# Patient Record
Sex: Male | Born: 1940 | Race: White | Hispanic: No | Smoking: Never smoker
Health system: Southern US, Community
[De-identification: ages and names within clinical notes are randomized; demographics above are authoritative.]

## PROBLEM LIST (undated history)

## (undated) DIAGNOSIS — S069X9A Unspecified intracranial injury with loss of consciousness of unspecified duration, initial encounter: Secondary | ICD-10-CM

## (undated) DIAGNOSIS — Q8500 Neurofibromatosis, unspecified: Secondary | ICD-10-CM

## (undated) DIAGNOSIS — S069XAA Unspecified intracranial injury with loss of consciousness status unknown, initial encounter: Secondary | ICD-10-CM

---

## 2018-06-17 ENCOUNTER — Inpatient Hospital Stay (HOSPITAL_COMMUNITY)
Admission: EM | Admit: 2018-06-17 | Discharge: 2018-06-20 | DRG: 193 | Disposition: A | Discharge status: Home Health | Payer: Medicare Other | Attending: Internal Medicine | Admitting: Internal Medicine

## 2018-06-17 ENCOUNTER — Encounter (HOSPITAL_COMMUNITY): Payer: Self-pay | Admitting: Internal Medicine

## 2018-06-17 ENCOUNTER — Other Ambulatory Visit: Payer: Self-pay

## 2018-06-17 ENCOUNTER — Emergency Department (HOSPITAL_COMMUNITY): Payer: Medicare Other

## 2018-06-17 DIAGNOSIS — G894 Chronic pain syndrome: Secondary | ICD-10-CM | POA: Diagnosis present

## 2018-06-17 DIAGNOSIS — N179 Acute kidney failure, unspecified: Secondary | ICD-10-CM | POA: Diagnosis present

## 2018-06-17 DIAGNOSIS — J181 Lobar pneumonia, unspecified organism: Secondary | ICD-10-CM | POA: Diagnosis not present

## 2018-06-17 DIAGNOSIS — Z880 Allergy status to penicillin: Secondary | ICD-10-CM | POA: Diagnosis not present

## 2018-06-17 DIAGNOSIS — Z79891 Long term (current) use of opiate analgesic: Secondary | ICD-10-CM

## 2018-06-17 DIAGNOSIS — J189 Pneumonia, unspecified organism: Secondary | ICD-10-CM | POA: Diagnosis present

## 2018-06-17 DIAGNOSIS — Z7983 Long term (current) use of bisphosphonates: Secondary | ICD-10-CM

## 2018-06-17 DIAGNOSIS — E875 Hyperkalemia: Secondary | ICD-10-CM | POA: Diagnosis present

## 2018-06-17 DIAGNOSIS — R4182 Altered mental status, unspecified: Secondary | ICD-10-CM | POA: Diagnosis not present

## 2018-06-17 DIAGNOSIS — Z87820 Personal history of traumatic brain injury: Secondary | ICD-10-CM

## 2018-06-17 DIAGNOSIS — G9341 Metabolic encephalopathy: Secondary | ICD-10-CM | POA: Diagnosis present

## 2018-06-17 DIAGNOSIS — G912 (Idiopathic) normal pressure hydrocephalus: Secondary | ICD-10-CM | POA: Diagnosis present

## 2018-06-17 DIAGNOSIS — J159 Unspecified bacterial pneumonia: Principal | ICD-10-CM | POA: Diagnosis present

## 2018-06-17 DIAGNOSIS — I10 Essential (primary) hypertension: Secondary | ICD-10-CM | POA: Diagnosis present

## 2018-06-17 DIAGNOSIS — J9601 Acute respiratory failure with hypoxia: Secondary | ICD-10-CM | POA: Diagnosis present

## 2018-06-17 DIAGNOSIS — Q8503 Schwannomatosis: Secondary | ICD-10-CM

## 2018-06-17 HISTORY — DX: Unspecified intracranial injury with loss of consciousness of unspecified duration, initial encounter: S06.9X9A

## 2018-06-17 HISTORY — DX: Unspecified intracranial injury with loss of consciousness status unknown, initial encounter: S06.9XAA

## 2018-06-17 HISTORY — DX: Neurofibromatosis, unspecified: Q85.00

## 2018-06-17 LAB — INFLUENZA PANEL BY PCR (TYPE A & B)
Influenza A By PCR: NEGATIVE
Influenza B By PCR: NEGATIVE

## 2018-06-17 LAB — STREP PNEUMONIAE URINARY ANTIGEN: Strep Pneumo Urinary Antigen: NEGATIVE

## 2018-06-17 LAB — COMPREHENSIVE METABOLIC PANEL
ALT: 20 U/L (ref 0–44)
AST: 57 U/L — AB (ref 15–41)
Albumin: 4.1 g/dL (ref 3.5–5.0)
Alkaline Phosphatase: 61 U/L (ref 38–126)
Anion gap: 14 (ref 5–15)
BUN: 27 mg/dL — AB (ref 8–23)
CO2: 27 mmol/L (ref 22–32)
Calcium: 9.2 mg/dL (ref 8.9–10.3)
Chloride: 95 mmol/L — ABNORMAL LOW (ref 98–111)
Creatinine, Ser: 1.52 mg/dL — ABNORMAL HIGH (ref 0.61–1.24)
GFR calc Af Amer: 51 mL/min — ABNORMAL LOW (ref >60)
GFR, EST NON AFRICAN AMERICAN: 44 mL/min — AB (ref >60)
Glucose, Bld: 121 mg/dL — ABNORMAL HIGH (ref 70–99)
Potassium: 5.9 mmol/L — ABNORMAL HIGH (ref 3.5–5.1)
Sodium: 136 mmol/L (ref 135–145)
Total Bilirubin: 1.8 mg/dL — ABNORMAL HIGH (ref 0.3–1.2)
Total Protein: 6.5 g/dL (ref 6.5–8.1)

## 2018-06-17 LAB — CBC WITH DIFFERENTIAL/PLATELET
Abs Immature Granulocytes: 0.06 10*3/uL (ref 0.00–0.07)
Basophils Absolute: 0 10*3/uL (ref 0.0–0.1)
Basophils Relative: 0 %
Eosinophils Absolute: 0.1 10*3/uL (ref 0.0–0.5)
Eosinophils Relative: 1 %
HCT: 44.4 % (ref 39.0–52.0)
Hemoglobin: 14.8 g/dL (ref 13.0–17.0)
Immature Granulocytes: 0 %
Lymphocytes Relative: 10 %
Lymphs Abs: 1.5 10*3/uL (ref 0.7–4.0)
MCH: 28.8 pg (ref 26.0–34.0)
MCHC: 33.3 g/dL (ref 30.0–36.0)
MCV: 86.4 fL (ref 80.0–100.0)
Monocytes Absolute: 1.2 10*3/uL — ABNORMAL HIGH (ref 0.1–1.0)
Monocytes Relative: 8 %
Neutro Abs: 12.6 10*3/uL — ABNORMAL HIGH (ref 1.7–7.7)
Neutrophils Relative %: 81 %
Platelets: 271 10*3/uL (ref 150–400)
RBC: 5.14 MIL/uL (ref 4.22–5.81)
RDW: 13.5 % (ref 11.5–15.5)
WBC: 15.5 10*3/uL — ABNORMAL HIGH (ref 4.0–10.5)
nRBC: 0 % (ref 0.0–0.2)

## 2018-06-17 LAB — URINALYSIS, ROUTINE W REFLEX MICROSCOPIC
BILIRUBIN URINE: NEGATIVE
Glucose, UA: NEGATIVE mg/dL
Hgb urine dipstick: NEGATIVE
Ketones, ur: NEGATIVE mg/dL
Leukocytes, UA: NEGATIVE
Nitrite: NEGATIVE
PH: 5 (ref 5.0–8.0)
Protein, ur: NEGATIVE mg/dL
Specific Gravity, Urine: 1.017 (ref 1.005–1.030)

## 2018-06-17 LAB — PROTIME-INR
INR: 1.03
Prothrombin Time: 13.4 seconds (ref 11.4–15.2)

## 2018-06-17 LAB — LACTIC ACID, PLASMA
Lactic Acid, Venous: 1.9 mmol/L (ref 0.5–1.9)
Lactic Acid, Venous: 1.6 mmol/L (ref 0.5–1.9)

## 2018-06-17 MED ORDER — PREGABALIN 100 MG PO CAPS
200.0000 mg | ORAL_CAPSULE | Freq: Two times a day (BID) | ORAL | Status: DC
  Administered 2018-06-17 – 2018-06-20 (×7): 200 mg via ORAL
  Filled 2018-06-17 (×2): qty 2
  Filled 2018-06-17: qty 4
  Filled 2018-06-17: qty 2
  Filled 2018-06-17: qty 4
  Filled 2018-06-17 (×2): qty 2

## 2018-06-17 MED ORDER — VITAMIN D (ERGOCALCIFEROL) 1.25 MG (50000 UNIT) PO CAPS: 50000.0000 [IU] | ORAL_CAPSULE | ORAL | Status: DC

## 2018-06-17 MED ORDER — PANTOPRAZOLE SODIUM 40 MG PO TBEC
40.0000 mg | DELAYED_RELEASE_TABLET | Freq: Every day | ORAL | Status: DC
  Administered 2018-06-17 – 2018-06-20 (×4): 40 mg via ORAL
  Filled 2018-06-17 (×4): qty 1

## 2018-06-17 MED ORDER — MORPHINE SULFATE ER 30 MG PO TBCR
30.0000 mg | EXTENDED_RELEASE_TABLET | Freq: Two times a day (BID) | ORAL | Status: DC
  Administered 2018-06-17 – 2018-06-20 (×7): 30 mg via ORAL
  Filled 2018-06-17 (×7): qty 1

## 2018-06-17 MED ORDER — DOCUSATE SODIUM 100 MG PO CAPS
100.0000 mg | ORAL_CAPSULE | Freq: Every day | ORAL | Status: DC
  Administered 2018-06-17 – 2018-06-19 (×3): 100 mg via ORAL
  Filled 2018-06-17 (×3): qty 1

## 2018-06-17 MED ORDER — VANCOMYCIN HCL IN DEXTROSE 1-5 GM/200ML-% IV SOLN
1000.0000 mg | Freq: Once | INTRAVENOUS | Status: AC
  Administered 2018-06-17: 1000 mg via INTRAVENOUS
  Filled 2018-06-17: qty 200

## 2018-06-17 MED ORDER — SODIUM CHLORIDE 0.9% FLUSH
3.0000 mL | Freq: Once | INTRAVENOUS | Status: AC
  Administered 2018-06-17: 3 mL via INTRAVENOUS

## 2018-06-17 MED ORDER — DULOXETINE HCL 20 MG PO CPEP
40.0000 mg | ORAL_CAPSULE | Freq: Every day | ORAL | Status: DC
  Administered 2018-06-17 – 2018-06-20 (×4): 40 mg via ORAL
  Filled 2018-06-17 (×4): qty 2

## 2018-06-17 MED ORDER — METOPROLOL SUCCINATE ER 25 MG PO TB24
25.0000 mg | ORAL_TABLET | Freq: Two times a day (BID) | ORAL | Status: DC
  Administered 2018-06-17 – 2018-06-20 (×7): 25 mg via ORAL
  Filled 2018-06-17 (×7): qty 1

## 2018-06-17 MED ORDER — PIPERACILLIN-TAZOBACTAM 3.375 G IVPB
3.3750 g | Freq: Once | INTRAVENOUS | Status: AC
  Administered 2018-06-17: 3.375 g via INTRAVENOUS
  Filled 2018-06-17: qty 50

## 2018-06-17 MED ORDER — LEVOFLOXACIN IN D5W 750 MG/150ML IV SOLN
750.0000 mg | INTRAVENOUS | Status: DC
  Administered 2018-06-17: 750 mg via INTRAVENOUS
  Filled 2018-06-17: qty 150

## 2018-06-17 MED ORDER — ENOXAPARIN SODIUM 30 MG/0.3ML ~~LOC~~ SOLN
30.0000 mg | SUBCUTANEOUS | Status: DC
  Administered 2018-06-17 – 2018-06-18 (×2): 30 mg via SUBCUTANEOUS
  Filled 2018-06-17 (×2): qty 0.3

## 2018-06-17 MED ORDER — HYDROCODONE-ACETAMINOPHEN 5-325 MG PO TABS
1.0000 | ORAL_TABLET | Freq: Two times a day (BID) | ORAL | Status: DC | PRN
  Administered 2018-06-18 – 2018-06-19 (×2): 1 via ORAL
  Filled 2018-06-17 (×2): qty 1

## 2018-06-17 MED ORDER — SODIUM CHLORIDE 0.9 % IV SOLN
INTRAVENOUS | Status: DC
  Administered 2018-06-17 – 2018-06-20 (×3): via INTRAVENOUS

## 2018-06-17 MED ORDER — B COMPLEX-C PO TABS
1.0000 | ORAL_TABLET | Freq: Every day | ORAL | Status: DC
  Administered 2018-06-17 – 2018-06-20 (×4): 1 via ORAL
  Filled 2018-06-17 (×4): qty 1

## 2018-06-17 MED ORDER — ALBUTEROL SULFATE (2.5 MG/3ML) 0.083% IN NEBU: 2.5000 mg | INHALATION_SOLUTION | RESPIRATORY_TRACT | Status: DC | PRN

## 2018-06-17 NOTE — Evaluation (Signed)
Physical Therapy Evaluation Patient Details Name: Nathaniel Pacheco MRN: [id number] DOB: [birth year] Today's Date: 06/17/2018   History of Present Illness  Patient is a 78 y/o male who presents with AMS. CXR-shows bilateral lower lobe consolidation. Admitted with acute respiratory failure with hypoxia secondary to CAP- bilateral lower lobe pneumonia versus atelectasis. 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 Pt lives with spouse and reports being Mod I for ADLs and ambulation. Not sure of accuracy of reported PLOF as pt with hx of cognitive deficits at baseline. No family present. Today, pt tolerated standing with heavy mod A and taking a few steps along side bed with Mod A with difficulty advancing RLE. Would benefit from SNF to maximize independence and mobility prior to return home. Will follow acutely.    Follow Up Recommendations SNF;Supervision for mobility/OOB    Equipment Recommendations  None recommended by PT    Recommendations for Other Services       Precautions / Restrictions Precautions Precautions: Fall Restrictions Weight Bearing Restrictions: No      Mobility  Bed Mobility Overal bed mobility: Needs Assistance Bed Mobility: Supine to Sit     Supine to sit: Min assist;HOB elevated     General bed mobility comments: increased time and use of rail to get to EOB with cues. POsterior lean.  Transfers Overall transfer level: Needs assistance Equipment used: Rolling walker (2 wheeled) Transfers: Sit to/from Stand Sit to Stand: Mod assist;From elevated surface         General transfer comment: Heavy Mod A to power to standing with cues for hand placement/technique. BLEs  gave out once up on first stand and able to stand again with increased WB through BUEs.   Ambulation/Gait Ambulation/Gait assistance: Mod assist Gait Distance (Feet): 3 Feet Assistive device: Rolling walker (2 wheeled) Gait Pattern/deviations: Step-to pattern;Trunk flexed Gait velocity: decreased   General Gait Details: Able to side step along side bed with assist for weight shifting; difficulty advancing RLE.  Stairs            Wheelchair Mobility    Modified Rankin (Stroke Patients Only)       Balance Overall balance assessment: Needs assistance Sitting-balance support: Feet supported;Single extremity supported Sitting balance-Leahy Scale: Fair Sitting balance - Comments: Posterior lean sitting EOB, fatigues quickly through trunk Postural control: Posterior lean Standing balance support: During functional activity;Bilateral upper extremity supported Standing balance-Leahy Scale: Poor Standing balance comment: Requires BUE support and external support ins tanding.                             Pertinent Vitals/Pain Pain Assessment: Faces Faces Pain Scale: Hurts little more Pain Location: RLE with movement Pain Descriptors / Indicators: Aching;Sore Pain Intervention(s): Monitored during session;Repositioned    Home Living Family/patient expects to be discharged to:: Private residence Living Arrangements: Spouse/significant other Available Help at Discharge: Family Type of Home: House Home Access: Stairs to enter Entrance Stairs-Rails: Right Entrance Stairs-Number of Steps: 3 + 3 Home Layout: One level Home Equipment: Walker - 2 wheels;Cane - single point;Shower seat;Wheelchair - manual Additional Comments: not sure of accuracy of reported PLOF due to cognitive deficits.    Prior Function Level of Independence: Independent with assistive device(s)  Comments: uses RW vs SPC for ambulation. Does not drive. Does own ADLs.     Hand  Dominance   Dominant Hand: Right    Extremity/Trunk Assessment   Upper Extremity Assessment Upper Extremity Assessment: Defer to OT evaluation    Lower Extremity Assessment Lower Extremity Assessment: RLE deficits/detail RLE Sensation: decreased light touch RLE Coordination: decreased fine motor;decreased gross motor    Cervical / Trunk Assessment Cervical / Trunk Assessment: Kyphotic  Communication   Communication: No difficulties  Cognition Arousal/Alertness: Awake/alert Behavior During Therapy: WFL for tasks assessed/performed Overall Cognitive Status: Impaired/Different from baseline Area of Impairment: Orientation;Awareness;Problem solving;Memory                 Orientation Level: Disoriented to;Situation   Memory: Decreased short-term memory;Decreased recall of precautions Following Commands: Follows multi-step commands inconsistently;Follows one step commands with increased time   Awareness: Intellectual Problem Solving: Requires verbal cues;Slow processing General Comments: "How old am i?" Able to state bday but not able to problem solve how old he is despite knowing the year he was born and this year. Tangential in speech.      General Comments      Exercises     Assessment/Plan    PT Assessment Patient needs continued PT services  PT Problem List Decreased strength;Decreased mobility;Decreased coordination;Decreased activity tolerance;Decreased cognition;Pain;Impaired sensation;Decreased balance       PT Treatment Interventions Functional mobility training;Balance training;Patient/family education;Gait training;Therapeutic activities;Therapeutic exercise;Neuromuscular re-education;Wheelchair mobility training;DME instruction    PT Goals (Current goals can be found in the Care Plan section)  Acute Rehab PT Goals Patient Stated Goal: to get back to moving and driving PT Goal Formulation: With patient Time For Goal Achievement: 07/01/18 Potential to  Achieve Goals: Fair    Frequency Min 3X/week   Barriers to discharge   not sure of support at home    Co-evaluation               AM-PAC PT "6 Clicks" Mobility  Outcome Measure Help needed turning from your back to your side while in a flat bed without using bedrails?: A Little Help needed moving from lying on your back to sitting on the side of a flat bed without using bedrails?: A Little Help needed moving to and from a bed to a chair (including a wheelchair)?: A Lot Help needed standing up from a chair using your arms (e.g., wheelchair or bedside chair)?: A Lot Help needed to walk in hospital room?: A Lot Help needed climbing 3-5 steps with a railing? : A Lot 6 Click Score: 14    End of Session Equipment Utilized During Treatment: Gait belt Activity Tolerance: Patient limited by fatigue Patient left: in bed;with call bell/phone within reach;with bed alarm set Nurse Communication: Mobility status(need for recliner for room) PT Visit Diagnosis: Unsteadiness on feet (R26.81);Muscle weakness (generalized) (M62.81);Difficulty in walking, not elsewhere classified (R26.2);Pain Pain - Right/Left: Right Pain - part of body: Leg    Time: 1530-1600 PT Time Calculation (min) (ACUTE ONLY): 30 min   Charges:   PT Evaluation $PT Eval Moderate Complexity: 1 Mod PT Treatments $Therapeutic Activity: 8-22 mins        Mylo Red, PT, DPT Acute Rehabilitation Services Pager [phone] Office [phone]      Marcy Panning 06/17/2018, 4:33 PM

## 2018-06-17 NOTE — ED Triage Notes (Signed)
Pt brought in by EMS called by pt's wife. Wife told EMS that pt has had progressive AMS since yesterday at around 1900.  Pt normally uses walker and has tremors at baseline. Pt temp 102.6 in the field BP149/84, 94 bpm, CBG 151.Pt not following commands, hx of traumatic brain injury. O2 saturation 84% on RA, placed on 4L and O2 up to 95% prior to arrival.

## 2018-06-17 NOTE — ED Provider Notes (Signed)
[facility]   CSN: [id number] Arrival date & time: 06/17/18  0230     History   Chief Complaint Chief Complaint  Patient presents with  . Altered Mental Status    HPI Nathaniel Pacheco is a 78 y.o. male.  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  He also has had a subdural hematoma in the past.  He presents today for evaluation of altered mental status.  His wife states that he became weak and somewhat confused this evening.  She denies any specific complaints and the patient denies any specific aches or pains.  He is somewhat difficult to get a history from as he appears confused.  He was found to have a fever by EMS.  He was also noted to be initially hypoxic with O2 sats of 84%.  These increased with supplemental oxygen.  The history is provided by the patient.  Altered Mental Status  Presenting symptoms: disorientation   Severity:  Moderate Most recent episode:  Today Episode history:  Single Timing:  Constant Progression:  Unchanged Chronicity:  New   No past medical history on file.  There are no active problems to display for this patient.        Home Medications    Prior to Admission medications   Not on File    Family History No family history on file.  Social History Social History   Tobacco Use  . Smoking status: Not on file  Substance Use Topics  . Alcohol use: Not on file  . Drug use: Not on file     Allergies   Patient has no allergy information on record.   Review of Systems Review of Systems  All other systems reviewed and are negative.    Physical Exam Updated Vital Signs BP (!) 153/79 (BP Location: Left Arm)   Pulse 92   Temp (!) 102.8 F (39.3 C) (Oral)   SpO2 98%   Physical Exam Vitals signs and nursing note reviewed.  Constitutional:      General:  He is not in acute distress.    Appearance: He is well-developed. He is not diaphoretic.     Comments: Patient is a 78 year old male.  He appears somewhat confused.  He is slow to respond to questions and commands, but will communicate more readily with his wife.  HENT:     Head: Normocephalic and atraumatic.  Neck:     Musculoskeletal: Normal range of motion and neck supple.  Cardiovascular:     Rate and Rhythm: Normal rate and regular rhythm.     Heart sounds: No murmur. No friction rub.  Pulmonary:     Effort: Pulmonary effort is normal. No respiratory distress.     Breath sounds: Normal breath sounds. No wheezing or rales.  Abdominal:     General: Bowel sounds are normal. There is no distension.     Palpations: Abdomen is soft.     Tenderness: There is no abdominal tenderness.  Musculoskeletal: Normal range of motion.        General: No swelling or tenderness.     Right lower leg: No edema.     Left lower leg: No edema.  Skin:    General: Skin is warm and dry.  Neurological:     Mental Status: He is alert. He is disoriented.     Cranial Nerves:  No cranial nerve deficit.     Coordination: Coordination normal.      ED Treatments / Results  Labs (all labs ordered are listed, but only abnormal results are displayed) Labs Reviewed  CULTURE, BLOOD (ROUTINE X 2)  CULTURE, BLOOD (ROUTINE X 2)  URINE CULTURE  COMPREHENSIVE METABOLIC PANEL  LACTIC ACID, PLASMA  LACTIC ACID, PLASMA  CBC WITH DIFFERENTIAL/PLATELET  PROTIME-INR  URINALYSIS, ROUTINE W REFLEX MICROSCOPIC    EKG EKG Interpretation  Date/Time:  Friday June 17 2018 02:40:39 EST Ventricular Rate:  93 PR Interval:    QRS Duration: 90 QT Interval:  342 QTC Calculation: 426 R Axis:   -17 Text Interpretation:  Sinus rhythm Borderline left axis deviation Consider anterior infarct Baseline wander in lead(s) V6 Confirmed by Geoffery LyonseLo, Ellyana Crigler ([id number]) on 06/17/2018 3:10:56 AM   Radiology No results  found.  Procedures Procedures (including critical care time)  Medications Ordered in ED Medications  sodium chloride flush (NS) 0.9 % injection 3 mL (3 mLs Intravenous Given 06/17/18 0258)     Initial Impression / Assessment and Plan / ED Course  I have reviewed the triage vital signs and the nursing notes.  Pertinent labs & imaging results that were available during my care of the patient were reviewed by me and considered in my medical decision making (see chart for details).  Patient presenting with complaints of progressive weakness since yesterday.  He was somewhat confused this evening and wife called EMS.  The patient was found to have a fever of 102.8.  He also has a white count of 15,000 and x-ray that shows what appears to be bilateral lower lobe pneumonia.  The patient was given broad-spectrum antibiotics and will be admitted to the hospitalist service under the care of Dr. Julian ReilGardner.  Head CT was negative for acute CVA.  CRITICAL CARE Performed by: Geoffery Lyonsouglas Dalayah Deahl Total critical care time: 45 minutes Critical care time was exclusive of separately billable procedures and treating other patients. Critical care was necessary to treat or prevent imminent or life-threatening deterioration. Critical care was time spent personally by me on the following activities: development of treatment plan with patient and/or surrogate as well as nursing, discussions with consultants, evaluation of patient's response to treatment, examination of patient, obtaining history from patient or surrogate, ordering and performing treatments and interventions, ordering and review of laboratory studies, ordering and review of radiographic studies, pulse oximetry and re-evaluation of patient's condition.   Final Clinical Impressions(s) / ED Diagnoses   Final diagnoses:  None    ED Discharge Orders    None       Geoffery Lyonselo, Kinsey Cowsert, MD 06/17/18 [phone]

## 2018-06-17 NOTE — H&P (Signed)
History and Physical    Nathaniel Pacheco NFA:[id number] DOB: April 09, 1941 DOA: 06/17/2018  PCP: No primary care provider on file.  Dr. Lucretia Roers year. Patient coming from: Home.  I have personally briefly reviewed patient's old medical records available.   Chief Complaint: Altered mental status and confusion.  HPI: Nathaniel Pacheco is a 78 y.o. 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  Patient is currently not able to give any history due to confusion.  I reviewed his previous records from care everywhere, his neurological notes and also called his wife to discuss about him. According to patient's wife, after he had his subdural hematoma last year, he had been progressively and intermittently confused and having vertigo.  He is following up with neurosurgery, recent CT scans were stable.  They were planning to do MRI of the brain to see for progression of neurofibromatosis or any effect on vestibular nerve.  He has suffering from confusion and disorientation at home for at least 1 month.His wife noted that last night he was more confused and was feeling very weak so she called EMS.  She never noticed any fever, flulike symptoms.  She never noticed any cough.  Patient himself could not describe any symptoms.  EMS found him with temperature of 102.  He was noted to be hypoxic and 84% on room air at home. ED Course: Temperature 102.8.  Blood pressure is stable.  WBC count 15.5.  Creatinine 1.52, baseline unknown.  Potassium 5.9.  Chest x-ray shows bilateral lower lobe consolidation.  CT head shows no acute findings, however dilated ventricles and atrophy.  Flu swab was negative.  Lactic acid was normal.  Patient was given IV  fluids, vancomycin and Zosyn.  Review of Systems: Limited due to confusion.   Past Medical History:  Diagnosis Date  . Neurofibromatosis (HCC)   . TBI (traumatic brain injury) [facility])     History reviewed. No pertinent surgical history.   has no history on file for tobacco, alcohol, and drug.  Allergies  Allergen Reactions  . Penicillins Itching, Other (See Comments) and Swelling    Hives Arm Erythema and Swelling at the site of IM Injection     History reviewed. No pertinent family history.   Prior to Admission medications   Medication Sig Start Date End Date Taking? Authorizing Provider  alendronate (FOSAMAX) 70 MG tablet Take 70 mg by mouth every Monday. 09/20/17  Yes [provider]  B Complex-C (B-COMPLEX WITH VITAMIN C) tablet Take 1 tablet by mouth daily.   Yes [provider]  docusate sodium (COLACE) 100 MG capsule Take 100 mg by mouth at bedtime.   Yes [provider]  DULoxetine (CYMBALTA) 20 MG capsule Take 40 mg by mouth daily. 04/15/18  Yes [provider]  enalapril (VASOTEC) 20 MG tablet Take 20 mg by mouth 2 (two) times daily. 04/14/18  Yes [provider]  hydrochlorothiazide (HYDRODIURIL) 25 MG tablet Take 25 mg by mouth daily. 09/16/15  Yes [provider]  HYDROcodone-acetaminophen (NORCO/VICODIN) 5-325 MG tablet Take 1 tablet by mouth 2 (two) times daily as needed for moderate pain.  12/31/17  Yes [provider]  ibuprofen (ADVIL,MOTRIN) 200 MG tablet Take 200 mg by mouth every 6 (six) hours as needed for mild pain.  Yes [provider]  meclizine (ANTIVERT) 25 MG tablet Take 25 mg by mouth 3 (three) times daily. 06/01/18  Yes [provider]  metoprolol succinate (TOPROL-XL) 25 MG 24 hr tablet Take 25 mg by mouth 2 (two) times daily. 12/04/14  Yes [provider]  morphine (MS CONTIN) 30 MG 12 hr tablet Take 30 mg by mouth 2 (two) times daily. 05/30/18 06/29/18 Yes [provider]  omeprazole (PRILOSEC) 20 MG capsule Take 20 mg by mouth daily. 11/17/14  Yes [provider]  pregabalin (LYRICA) 200 MG capsule Take 200 mg by mouth 2 (two) times daily. 04/27/18  Yes [provider]  Vitamin D, Ergocalciferol, (DRISDOL) 1.25 MG (50000 UT) CAPS capsule Take 50,000 Units by mouth every Tuesday.  04/18/18  Yes [provider]    Physical Exam: Vitals:   06/17/18 0655 06/17/18 0700 06/17/18 0730 06/17/18 0745  BP: (!) 115/57 (!) 106/55  120/63  Pulse: 66 65 63 71  Resp: 14 13 15 18   Temp:      TempSrc:      SpO2: 100% 100% 98% 100%  Weight:      Height:        Constitutional: NAD, calm, comfortable Vitals:   06/17/18 0655 06/17/18 0700 06/17/18 0730 06/17/18 0745  BP: (!) 115/57 (!) 106/55  120/63  Pulse: 66 65 63 71  Resp: 14 13 15 18   Temp:      TempSrc:      SpO2: 100% 100% 98% 100%  Weight:      Height:       Eyes: PERRL, lids and conjunctivae normal ENMT: Mucous membranes are dry. Posterior pharynx clear of any exudate or lesions.Normal dentition.  Neck: normal, supple, no masses, no thyromegaly Respiratory: clear to auscultation bilaterally, no wheezing, no crackles. Normal respiratory effort. No accessory muscle use.  Patient is without any distress on waking up.  He is on 3 L of oxygen right now. Cardiovascular: Regular rate and rhythm, no murmurs / rubs / gallops. No extremity edema. 2+ pedal pulses. No carotid bruits.  Abdomen: no tenderness, no masses palpated. No hepatosplenomegaly. Bowel sounds positive.  Musculoskeletal: no clubbing / cyanosis. No joint deformity upper and lower extremities. Good ROM, no contractures. Normal muscle tone.  Skin: no rashes, lesions, ulcers. No induration Neurologic: CN 2-12 grossly intact. Sensation intact, DTR normal. Strength 5/5 in all 4.  Psychiatric: Sleepy, pleasantly confused.  Alert and awake but not oriented.    Labs on Admission: I have personally reviewed  following labs and imaging studies  CBC: Recent Labs  Lab 06/17/18 0242  WBC 15.5*  NEUTROABS 12.6*  HGB 14.8  HCT 44.4  MCV 86.4  PLT 271   Basic Metabolic Panel: Recent Labs  Lab 06/17/18 0242  NA 136  K 5.9*  CL 95*  CO2 27  GLUCOSE 121*  BUN 27*  CREATININE 1.52*  CALCIUM 9.2   GFR: Estimated Creatinine Clearance: 45 mL/min (A) (by C-G formula based on SCr of 1.52 mg/dL (H)). Liver Function Tests: Recent Labs  Lab 06/17/18 0242  AST 57*  ALT 20  ALKPHOS 61  BILITOT 1.8*  PROT 6.5  ALBUMIN 4.1   No results for input(s): LIPASE, AMYLASE in the last 168 hours. No results for input(s): AMMONIA in the last 168 hours. Coagulation Profile: Recent Labs  Lab 06/17/18 0352  INR 1.03   Cardiac Enzymes: No results for input(s): CKTOTAL, CKMB, CKMBINDEX, TROPONINI in the last 168 hours. BNP (last 3  results) No results for input(s): PROBNP in the last 8760 hours. HbA1C: No results for input(s): HGBA1C in the last 72 hours. CBG: No results for input(s): GLUCAP in the last 168 hours. Lipid Profile: No results for input(s): CHOL, HDL, LDLCALC, TRIG, CHOLHDL, LDLDIRECT in the last 72 hours. Thyroid Function Tests: No results for input(s): TSH, T4TOTAL, FREET4, T3FREE, THYROIDAB in the last 72 hours. Anemia Panel: No results for input(s): VITAMINB12, FOLATE, FERRITIN, TIBC, IRON, RETICCTPCT in the last 72 hours. Urine analysis:    Component Value Date/Time   COLORURINE YELLOW 06/17/2018 0414   APPEARANCEUR CLEAR 06/17/2018 0414   LABSPEC 1.017 06/17/2018 0414   PHURINE 5.0 06/17/2018 0414   GLUCOSEU NEGATIVE 06/17/2018 0414   HGBUR NEGATIVE 06/17/2018 0414   BILIRUBINUR NEGATIVE 06/17/2018 0414   KETONESUR NEGATIVE 06/17/2018 0414   PROTEINUR NEGATIVE 06/17/2018 0414   NITRITE NEGATIVE 06/17/2018 0414   LEUKOCYTESUR NEGATIVE 06/17/2018 0414    Radiological Exams on Admission: Dg Chest 2 View  Result Date: 06/17/2018 CLINICAL DATA:  Suspected sepsis.   Altered mental status EXAM: CHEST - 2 VIEW COMPARISON:  None. FINDINGS: Hazy opacities at the left more than right base. Borderline heart size accentuated by low volumes. No effusion or pneumothorax. Artifact from EKG leads. IMPRESSION: Low volume chest with asymmetric lower lung opacities from pneumonia, aspiration, or atelectasis. Electronically Signed   By: Marnee SpringJonathon  Watts M.D.   On: 06/17/2018 05:21   Ct Head Wo Contrast  Result Date: 06/17/2018 CLINICAL DATA:  Progressive altered mental status since yesterday. EXAM: CT HEAD WITHOUT CONTRAST TECHNIQUE: Contiguous axial images were obtained from the base of the skull through the vertex without intravenous contrast. COMPARISON:  None. FINDINGS: Brain: Mild cerebral atrophy. Ventricular dilatation is somewhat out of proportion to the degree of peripheral atrophy and may indicate normal pressure hydrocephalus or prominent central atrophy. Low-attenuation changes in the deep white matter consistent with small vessel ischemia. No mass effect or midline shift. No abnormal extra-axial fluid collections. Gray-white matter junctions are distinct. Basal cisterns are not effaced. No acute intracranial hemorrhage. Vascular: Mild intracranial arterial vascular calcifications. Skull: Calvarium appears intact. No acute depressed skull fractures. Sinuses/Orbits: Paranasal sinuses and mastoid air cells are clear. Other: None IMPRESSION: Ventricular dilatation out of proportion to the degree of peripheral atrophy. Possibility of normal pressure hydrocephalus versus prominent central atrophy. No acute intracranial hemorrhage or mass effect. Chronic atrophy and small vessel ischemic changes. Electronically Signed   By: Burman NievesWilliam  Stevens M.D.   On: 06/17/2018 03:33    EKG: Independently reviewed. Sinus rhythm.  No acute ST-T wave changes.  Assessment/Plan Principal Problem:   Community acquired pneumonia of both lower lobes (HCC) Active Problems:   AKI (acute kidney  injury) (HCC)   Hyperkalemia   Acute respiratory failure with hypoxia (HCC)   History of traumatic brain injury   Acute metabolic encephalopathy   Pneumonia     1.  Community-acquired pneumonia, bilateral lower lobe pneumonia versus atelectasis: Patient with fever, leukocytosis and hypoxia.  Agree with admission given severity of symptoms. Antibiotics to treat bacterial pneumonia.  Received vancomycin and Zosyn in the ER.  Will continue with Levaquin only.Chest physiotherapy, incentive spirometry, deep breathing exercises, sputum induction, mucolytic's and bronchodilators.Sputum cultures, blood cultures, Legionella and streptococcal antigen. Supplemental oxygen to keep saturations more than 90%. Consult PT OT and also speech therapy to evaluate for aspiration.  2.  Acute respiratory failure with hypoxia: Due to #1.  Given oxygen to keep saturation more than 90%.  3.  Confusion disorientation: Acute metabolic encephalopathy: Unknown how much of this is new.  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  We will continue to monitor.  No focal deficits.  4.  Hyperkalemia: Patient on potassium supplements and ACE inhibitor's.  Will hydrate with IV fluids.  Monitor levels.  5.  Abnormal renal functions: Baseline creatinine not available.  Hydrate and monitor.  No urinary retention.  6.  Neurofibromatosis, history of subdural hematoma: Currently remains stable.  He may have progressive disease.  [facility]  I suggested patient's wife to make an appointment for follow-up.  CT scan today shows dilated ventricles, progressive disease.  He will follow-up with neurosurgery.  7.  Chronic pain syndrome: Patient on scheduled and as needed opiates.  He is on chronic opiates and Lyrica.  He will continue this.  Discussed plan of care with patient's wife over the phone.  DVT prophylaxis:  Lovenox. Code Status: Full code. Family Communication: Wife over the phone. Disposition Plan: Home with home health care. Consults called: None. Admission status: Inpatient.  Telemetry.   Dorcas CarrowKuber Marlayna Bannister MD [facility] Pager [phone]  If 7PM-7AM, please contact night-coverage [email or web address] Password [email or web address]  06/17/2018, 7:54 AM

## 2018-06-17 NOTE — Evaluation (Signed)
Clinical/Bedside Swallow Evaluation Patient Details  Name: Nathaniel Pacheco MRN: [id number] Date of Birth: 1941/04/17  Today's Date: 06/17/2018 Time: SLP Start Time (ACUTE ONLY): 1253 SLP Stop Time (ACUTE ONLY): 1319 SLP Time Calculation (min) (ACUTE ONLY): 26 min  Past Medical History:  Past Medical History:  Diagnosis Date  . Neurofibromatosis (HCC)   . TBI (traumatic brain injury) [facility])    Past Surgical History: History reviewed. No pertinent surgical history. HPI:  Pt is a 78 yo male admitted with AMS, with CXR concerning for BLL PNA. Aspiration is not excluded. PMH includes neurofibromatisis, schwannomatosis, TBI with SDH s/p evacuation ~1 year ago, chronic vertigo, chronic pain, HTN   Assessment / Plan / Recommendation Clinical Impression  Pt's swallowing function appears to be impacted by current mentation and lack missing dentition (partial dentures not present), but with no overt signs of aspiration observed. He is distractible and needs help with problem solving to set-up his tray and to get himself into a safer position. He then takes over self-feeding. He has prolonged mastication and oral residue, requiring multiple liquid washes to clear. SLP assisted with chopping and softening foods, which facilitated oral preparation and clearance.  Frequent eructation was noted throughout eval, although he was drinking a carbonated beverage. Will need additional monitoring to see if he may have an esophageal component as well. Recommend Dys 2 diet and thin liquids. SLP to f/u for tolerance and potential to advance.  SLP Visit Diagnosis: Dysphagia, unspecified (R13.10)    Aspiration Risk  Mild aspiration risk    Diet Recommendation Dysphagia 2 (Fine chop);Thin liquid   Liquid Administration via: Cup;Straw Medication Administration: Whole meds with liquid Supervision: Patient able to self feed;Intermittent supervision to cue for compensatory strategies;Comment(set-up assist) Compensations:  Minimize environmental distractions;Slow rate;Small sips/bites;Follow solids with liquid Postural Changes: Seated upright at 90 degrees;Remain upright for at least 30 minutes after po intake    Other  Recommendations Oral Care Recommendations: Oral care BID   Follow up Recommendations (tba)      Frequency and Duration min 2x/week  2 weeks       Prognosis Prognosis for Safe Diet Advancement: Good Barriers to Reach Goals: Cognitive deficits      Swallow Study   General HPI: Pt is a 78 yo male admitted with AMS, with CXR concerning for BLL PNA. Aspiration is not excluded. PMH includes neurofibromatisis, schwannomatosis, TBI with SDH s/p evacuation ~1 year ago, chronic vertigo, chronic pain, HTN Type of Study: Bedside Swallow Evaluation Previous Swallow Assessment: none in chart Diet Prior to this Study: Regular;Thin liquids Temperature Spikes Noted: Yes(102.8) Respiratory Status: Nasal cannula History of Recent Intubation: No Behavior/Cognition: Alert;Cooperative;Distractible;Requires cueing Oral Care Completed by SLP: No Oral Cavity - Dentition: Missing dentition;Dentures, not available(pt wears partial dentures - not available at this time) Vision: Functional for self-feeding Self-Feeding Abilities: Able to feed self;Needs set up Patient Positioning: Upright in bed Baseline Vocal Quality: Normal Volitional Cough: Weak Volitional Swallow: Able to elicit    Oral/Motor/Sensory Function Overall Oral Motor/Sensory Function: Within functional limits   Ice Chips Ice chips: Not tested   Thin Liquid Thin Liquid: Within functional limits Presentation: (can)    Nectar Thick Nectar Thick Liquid: Not tested   Honey Thick Honey Thick Liquid: Not tested   Puree Puree: Not tested   Solid     Solid: Impaired Presentation: Self Fed Oral Phase Impairments: Impaired mastication Oral Phase Functional Implications: Impaired mastication;Oral residue      Nathaniel Pacheco 06/17/2018,1:25  PM  Natalia Leatherwood, M.A. CCC-SLP Acute Herbalist [phone] Office [phone]

## 2018-06-17 NOTE — Progress Notes (Signed)
PHARMACY NOTE:  ANTIMICROBIAL RENAL DOSAGE ADJUSTMENT  Current antimicrobial regimen includes a mismatch between antimicrobial dosage and estimated renal function.  As per policy approved by the Pharmacy & Therapeutics and Medical Executive Committees, the antimicrobial dosage will be adjusted accordingly.  Current antimicrobial dosage:  Levofloxacin 750mg  IV q24h x 5 days  Indication: CAP  Renal Function:  Estimated Creatinine Clearance: 44.9 mL/min (A) (by C-G formula based on SCr of 1.52 mg/dL (H)). []      On intermittent HD, scheduled: []      On CRRT    Antimicrobial dosage has been changed to:  Levofloxacin 750mg  Iv q48h x 5 days  Additional comments:   Terrin Imparato A. Jeanella Craze, PharmD, BCPS Clinical Pharmacist [place] Pager: [phone] Please utilize Amion for appropriate phone number to reach the unit pharmacist [facility])   06/17/2018 9:34 AM

## 2018-06-18 LAB — BASIC METABOLIC PANEL
Anion gap: 13 (ref 5–15)
BUN: 21 mg/dL (ref 8–23)
CO2: 24 mmol/L (ref 22–32)
Calcium: 8.6 mg/dL — ABNORMAL LOW (ref 8.9–10.3)
Chloride: 98 mmol/L (ref 98–111)
Creatinine, Ser: 1.09 mg/dL (ref 0.61–1.24)
GFR calc Af Amer: >60 mL/min (ref >60)
GFR calc non Af Amer: >60 mL/min (ref >60)
GLUCOSE: 113 mg/dL — AB (ref 70–99)
Potassium: 4.4 mmol/L (ref 3.5–5.1)
Sodium: 135 mmol/L (ref 135–145)

## 2018-06-18 LAB — CBC
HCT: 40.6 % (ref 39.0–52.0)
HEMOGLOBIN: 13.5 g/dL (ref 13.0–17.0)
MCH: 28.5 pg (ref 26.0–34.0)
MCHC: 33.3 g/dL (ref 30.0–36.0)
MCV: 85.8 fL (ref 80.0–100.0)
Platelets: 191 10*3/uL (ref 150–400)
RBC: 4.73 MIL/uL (ref 4.22–5.81)
RDW: 13.1 % (ref 11.5–15.5)
WBC: 11 10*3/uL — ABNORMAL HIGH (ref 4.0–10.5)
nRBC: 0 % (ref 0.0–0.2)

## 2018-06-18 LAB — BLOOD CULTURE ID PANEL (REFLEXED)
Acinetobacter baumannii: NOT DETECTED
Candida albicans: NOT DETECTED
Candida glabrata: NOT DETECTED
Candida krusei: NOT DETECTED
Candida parapsilosis: NOT DETECTED
Candida tropicalis: NOT DETECTED
ESCHERICHIA COLI: NOT DETECTED
Enterobacter cloacae complex: NOT DETECTED
Enterobacteriaceae species: NOT DETECTED
Enterococcus species: NOT DETECTED
Haemophilus influenzae: NOT DETECTED
Klebsiella oxytoca: NOT DETECTED
Klebsiella pneumoniae: NOT DETECTED
Listeria monocytogenes: NOT DETECTED
Methicillin resistance: DETECTED — AB
Neisseria meningitidis: NOT DETECTED
PSEUDOMONAS AERUGINOSA: NOT DETECTED
Proteus species: NOT DETECTED
Serratia marcescens: NOT DETECTED
Staphylococcus aureus (BCID): NOT DETECTED
Staphylococcus species: DETECTED — AB
Streptococcus agalactiae: NOT DETECTED
Streptococcus pneumoniae: NOT DETECTED
Streptococcus pyogenes: NOT DETECTED
Streptococcus species: NOT DETECTED

## 2018-06-18 LAB — URINE CULTURE
Culture: NO GROWTH
Special Requests: NORMAL

## 2018-06-18 MED ORDER — LEVOFLOXACIN IN D5W 750 MG/150ML IV SOLN
750.0000 mg | INTRAVENOUS | Status: DC
  Administered 2018-06-18 – 2018-06-19 (×2): 750 mg via INTRAVENOUS
  Filled 2018-06-18 (×2): qty 150

## 2018-06-18 MED ORDER — QUETIAPINE FUMARATE 25 MG PO TABS
25.0000 mg | ORAL_TABLET | Freq: Every day | ORAL | Status: DC
  Administered 2018-06-19: 25 mg via ORAL
  Filled 2018-06-18: qty 1

## 2018-06-18 MED ORDER — QUETIAPINE FUMARATE 25 MG PO TABS
12.5000 mg | ORAL_TABLET | Freq: Every day | ORAL | Status: DC
  Administered 2018-06-19 – 2018-06-20 (×2): 12.5 mg via ORAL
  Filled 2018-06-18 (×2): qty 1

## 2018-06-18 MED ORDER — ENOXAPARIN SODIUM 40 MG/0.4ML ~~LOC~~ SOLN
40.0000 mg | SUBCUTANEOUS | Status: DC
  Administered 2018-06-19 – 2018-06-20 (×2): 40 mg via SUBCUTANEOUS
  Filled 2018-06-18 (×2): qty 0.4

## 2018-06-18 NOTE — Social Work (Signed)
Upon chart review pt does not have documentation entered into the system indicating a legal court appointed guardian. Should pt need assistance with decision making pt wife listed as Ihor Austin, [phone].  CSW continuing to follow for support with disposition when medically appropriate.  Octavio Graves, MSW, [facility] Clinical Social Work [phone]
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
## 2018-06-18 NOTE — Progress Notes (Signed)
PHARMACY NOTE:  ANTIMICROBIAL RENAL DOSAGE ADJUSTMENT  Current antimicrobial regimen includes a mismatch between antimicrobial dosage and estimated renal function.  As per policy approved by the Pharmacy & Therapeutics and Medical Executive Committees, the antimicrobial dosage will be adjusted accordingly.  Current antimicrobial dosage:  Levofloxacin 750mg  IV q48h   Indication: CAP  Renal Function:  Estimated Creatinine Clearance: 62.6 mL/min (by C-G formula based on SCr of 1.09 mg/dL). []      On intermittent HD, scheduled: []      On CRRT    Antimicrobial dosage has been changed to:  Adjust Levaquin to 750 mg IV every 24 hours - will keep original intended stop date of 2/11 for 5d LOT  Additional comments:  Thank you for allowing pharmacy to be a part of this patient's care.  Georgina Pillion, PharmD, BCPS Clinical Pharmacist Clinical phone for 06/18/2018: [phone] 06/18/2018 2:09 PM   **Pharmacist phone directory can now be found on [email or web address] ([email or web address]).  Listed under [facility].

## 2018-06-18 NOTE — Progress Notes (Signed)
PHARMACY - PHYSICIAN COMMUNICATION CRITICAL VALUE ALERT - BLOOD CULTURE IDENTIFICATION (BCID)  Nathaniel Pacheco is an 78 y.o. male who presented to [facility] on 06/17/2018 with a chief complaint of AMS  Assessment:  1/4 blood cultures with staph species (mec A positive) - likely contaminant. Pt with improved leukocytosis and fever curve on current abx.  Name of physician (or Provider) Contacted: K Ghimire  Current antibiotics: Levaquin  Changes to prescribed antibiotics recommended:  No changes needed at this time  No results found for this or any previous visit.  Christoper Fabian, PharmD, BCPS Clinical pharmacist  **Pharmacist phone directory can now be found on [email or web address] ([email or web address]).  Listed under [facility]. 06/18/2018  7:28 AM
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
## 2018-06-18 NOTE — NC FL2 (Signed)
[place] MEDICAID FL2 LEVEL OF CARE SCREENING TOOL     IDENTIFICATION  Patient Name: Nathaniel Pacheco Birthdate: Apr 01, 1941 Sex: male Admission Date (Current Location): 06/17/2018  [facility] and [place] Number:  Producer, television/film/video and Address:  [facility], [address] [place], [place] [address]      Provider Number: [id number]  Attending Physician Name and Address:  Fran Lowes, DO  Relative Name and Phone Number:  Ihor Austin; wife; [phone]    Current Level of Care: Hospital Recommended Level of Care: Skilled Nursing Facility Prior Approval Number:    Date Approved/Denied:   PASRR Number: [id number]  Discharge Plan: SNF    Current Diagnoses: Patient Active Problem List   Diagnosis Date Noted  . AKI (acute kidney injury) (HCC) 06/17/2018  . Hyperkalemia 06/17/2018  . Community acquired pneumonia of both lower lobes (HCC) 06/17/2018  . Acute respiratory failure with hypoxia (HCC) 06/17/2018  . History of traumatic brain injury 06/17/2018  . Acute metabolic encephalopathy 06/17/2018  . Pneumonia 06/17/2018    Orientation RESPIRATION BLADDER Height & Weight     Self, Time, Situation, Place  Normal Continent, External catheter Weight: 189 lb 6 oz (85.9 kg) Height:  5\' 9"  (175.3 cm)  BEHAVIORAL SYMPTOMS/MOOD NEUROLOGICAL BOWEL NUTRITION STATUS      Continent Diet(see discharge summary)  AMBULATORY STATUS COMMUNICATION OF NEEDS Skin   Extensive Assist Verbally Other (Comment)(bilateral cracking on feet)                       Personal Care Assistance Level of Assistance  Bathing, Feeding, Dressing Bathing Assistance: Maximum assistance Feeding assistance: Limited assistance Dressing Assistance: Maximum assistance     Functional Limitations Info  Sight, Hearing, Speech Sight Info: Adequate Hearing Info: Adequate Speech Info: Adequate    SPECIAL CARE FACTORS FREQUENCY  PT (By licensed PT), OT (By licensed OT)     PT  Frequency: 5x week OT Frequency: 5x week            Contractures Contractures Info: Not present    Additional Factors Info  Code Status, Allergies, Psychotropic Code Status Info: Full Code Allergies Info: Penicillins Psychotropic Info: DULoxetine (CYMBALTA) DR capsule 40 mg daily PO         Current Medications (06/18/2018):  This is the current hospital active medication list Current Facility-Administered Medications  Medication Dose Route Frequency Provider Last Rate Last Dose  . 0.9 %  sodium chloride infusion   Intravenous Continuous Dorcas Carrow, MD 75 mL/hr at 06/17/18 1204    . albuterol (PROVENTIL) (2.5 MG/3ML) 0.083% nebulizer solution 2.5 mg  2.5 mg Nebulization Q2H PRN Dorcas Carrow, MD      . B-complex with vitamin C tablet 1 tablet  1 tablet Oral Daily Dorcas Carrow, MD   1 tablet at 06/18/18 0948  . docusate sodium (COLACE) capsule 100 mg  100 mg Oral QHS Dorcas Carrow, MD   100 mg at 06/17/18 2227  . DULoxetine (CYMBALTA) DR capsule 40 mg  40 mg Oral Daily Dorcas Carrow, MD   40 mg at 06/17/18 1158  . enoxaparin (LOVENOX) injection 30 mg  30 mg Subcutaneous Q24H Dorcas Carrow, MD   30 mg at 06/18/18 0950  . HYDROcodone-acetaminophen (NORCO/VICODIN) 5-325 MG per tablet 1 tablet  1 tablet Oral BID PRN Dorcas Carrow, MD      . levofloxacin (LEVAQUIN) IVPB 750 mg  750 mg Intravenous Q48H Dorcas Carrow, MD 100 mL/hr at 06/17/18 1204 750  mg at 06/17/18 1204  . metoprolol succinate (TOPROL-XL) 24 hr tablet 25 mg  25 mg Oral BID Dorcas Carrow, MD   25 mg at 06/18/18 0948  . morphine (MS CONTIN) 12 hr tablet 30 mg  30 mg Oral BID Dorcas Carrow, MD   30 mg at 06/18/18 0948  . pantoprazole (PROTONIX) EC tablet 40 mg  40 mg Oral Daily Dorcas Carrow, MD   40 mg at 06/18/18 0949  . pregabalin (LYRICA) capsule 200 mg  200 mg Oral BID Dorcas Carrow, MD   200 mg at 06/18/18 0949  . [START ON 06/21/2018] Vitamin D (Ergocalciferol) (DRISDOL) capsule 50,000 Units  50,000 Units  Oral Q Coralie Common, MD         Discharge Medications: Please see discharge summary for a list of discharge medications.  Relevant Imaging Results:  Relevant Lab Results:   Additional Information [id number] [place], [place]

## 2018-06-18 NOTE — Progress Notes (Signed)
Notified by central monitoring that the telemetry order is expired. Paged Dr. Rana Snare to get the order renewed.

## 2018-06-19 ENCOUNTER — Encounter (HOSPITAL_COMMUNITY): Payer: Self-pay

## 2018-06-19 LAB — CULTURE, BLOOD (ROUTINE X 2): Special Requests: ADEQUATE

## 2018-06-19 LAB — LEGIONELLA PNEUMOPHILA SEROGP 1 UR AG: L. pneumophila Serogp 1 Ur Ag: NEGATIVE

## 2018-06-19 MED ORDER — IBUPROFEN 200 MG PO TABS
200.0000 mg | ORAL_TABLET | ORAL | Status: DC | PRN
  Administered 2018-06-19: 200 mg via ORAL
  Filled 2018-06-19: qty 1

## 2018-06-19 MED ORDER — HYDRALAZINE HCL 20 MG/ML IJ SOLN
10.0000 mg | Freq: Four times a day (QID) | INTRAMUSCULAR | Status: DC | PRN
  Administered 2018-06-19: 10 mg via INTRAVENOUS
  Filled 2018-06-19 (×2): qty 1

## 2018-06-19 MED ORDER — QUETIAPINE FUMARATE 25 MG PO TABS
50.0000 mg | ORAL_TABLET | Freq: Every day | ORAL | Status: DC
  Administered 2018-06-19: 50 mg via ORAL
  Filled 2018-06-19: qty 2

## 2018-06-19 MED ORDER — ENALAPRIL MALEATE 10 MG PO TABS
20.0000 mg | ORAL_TABLET | Freq: Two times a day (BID) | ORAL | Status: DC
  Administered 2018-06-19 – 2018-06-20 (×3): 20 mg via ORAL
  Filled 2018-06-19 (×3): qty 2
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
## 2018-06-19 NOTE — Plan of Care (Signed)
Pt with intermittent anxiety, argumentative, confused.  Reassurance provided and pt re-oriented as needed.
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
## 2018-06-20 MED ORDER — QUETIAPINE FUMARATE 50 MG PO TABS: 50.0000 mg | ORAL_TABLET | Freq: Every day | ORAL | 0 refills | Status: AC

## 2018-06-20 MED ORDER — HYDRALAZINE HCL 25 MG PO TABS
25.0000 mg | ORAL_TABLET | Freq: Three times a day (TID) | ORAL | Status: DC
  Administered 2018-06-20 (×2): 25 mg via ORAL
  Filled 2018-06-20 (×2): qty 1

## 2018-06-20 MED ORDER — QUETIAPINE FUMARATE 25 MG PO TABS: 12.5000 mg | ORAL_TABLET | Freq: Every day | ORAL | 0 refills | Status: AC

## 2018-06-20 MED ORDER — LEVOFLOXACIN 750 MG PO TABS: 750.0000 mg | ORAL_TABLET | Freq: Every day | ORAL | 0 refills | Status: AC

## 2018-06-20 MED ORDER — HYDRALAZINE HCL 25 MG PO TABS: 25.0000 mg | ORAL_TABLET | Freq: Three times a day (TID) | ORAL | 0 refills | Status: AC

## 2018-06-20 NOTE — Discharge Summary (Signed)
Physician Discharge Summary  [name] DOB: [birth year] DOA: 06/17/2018  PCP: No primary care provider on file.  Admit date: 06/17/2018 Discharge date: 06/20/2018  Recommendations for Outpatient Follow-up:  1. Pt is to follow up with his own neurologist and PCP. I have discussed this with the patient's wife. I had planned to consult neurology/neurosurgery as inpatient for the patient's normal pressure hydrocephalus. However, the patient's wife stated that she preferred to discuss this with the patient's own physicians as outpatient. I told her that she would need to call for an appointment.   Follow-up Information    [facility].   Specialty:  Home Health Services Why:  [facility], HHPT, [facility], [facility] Social Worker Contact information: [address] [place] [address] [place] [address] [phone]          Discharge Diagnoses: Principal diagnosis is #1 1. Pneumonia with acute hypoxic respiratory failure 2. Metabolic encephalopathy due to #1 in the setting of normal pressure hydrocephalus. 3.   Confusion and disorientation  Discharge Condition: Fair Disposition: Home  Diet recommendation: Heart healthy  Filed Weights   06/17/18 0414 06/17/18 0925  Weight: 86.2 kg 85.9 kg    History of present illness: Nathaniel Pacheco is a 78 y.o. 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  Patient is currently not able to give any history due to confusion.  I reviewed his previous records from care everywhere, his neurological notes and also called his wife to discuss about him. According to patient's wife, after he had his subdural hematoma last year, he had been progressively and  intermittently confused and having vertigo.  He is following up with neurosurgery, recent CT scans were stable.  They were planning to do MRI of the brain to see for progression of neurofibromatosis or any effect on vestibular nerve.  He has suffering from confusion and disorientation at home for at least 1 month.His wife noted that last night he was more confused and was feeling very weak so she called EMS.  She never noticed any fever, flulike symptoms.  She never noticed any cough.  Patient himself could not describe any symptoms.  EMS found him with temperature of 102.  He was noted to be hypoxic and 84% on room air at home. ED Course: Temperature 102.8.  Blood pressure is stable.  WBC count 15.5.  Creatinine 1.52, baseline unknown.  Potassium 5.9.  Chest x-ray shows bilateral lower lobe consolidation.  CT head shows no acute findings, however dilated ventricles and atrophy suggest normal pressure hydrocephalus.  Flu swab was negative.  Lactic acid was normal.  Patient was given IV fluids, vancomycin and Zosyn.   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 Patient is currently not able to give any history due to confusion. I reviewed his previous records from care everywhere, his neurological notes and also called his wife to discuss about him. According to patient's wife, after he had his subdural hematoma last year, he had been progressively and intermittently confused and having vertigo.  He is following up with neurosurgery, recent CT scans were stable. They were planning to do MRI of the brain to see for progression of neurofibromatosis or any effect on vestibular  nerve. He has suffering from confusion and disorientation at home for at least 1 month.His wife noted that last night he was more confused and was feeling very weak so she called EMS. She never noticed any fever, flulike symptoms. She never noticed any cough. Patient himself could not describe any symptoms. EMS found him with temperature of 102. He was noted to be hypoxic and 84% on room air at home.  In the ED the patient's temperature was 102.8. WBC was 15.2, Creatinine was elevated at 1.52, potassium was 5.9. BXR demonstrated bilateral consolidation. CT head demonstrated dilated ventricles and atrophy with no acute findings. Lactic acid was normal.   The patient was admitted to a medical bed. He is receiving IV fluids, vancomycin and Zosyn. He is receiving seroquel for his agitation.  The patient has been evaluated by PT and OT. They have recommended SNF, but the patient and his wife have declined and want for him to go home.  The patient is doing well. He no longer seems confused. I have discussed the patient with his wife. I have mentioned that I would consult neurology regarding his normal pressure hydrocephalus as a cause for his recent mental status changes and vertigo. She has stated that she prefers to follow up with the patient's own neurologist and PCP. She was advised that she would need to make an appointment for him to do this.  Today's assessment: S: Patient is sitting up in bed about to eat breakfast. No new complaints. O: Vitals:  Vitals:   06/20/18 1309 06/20/18 1312  BP: (!) 204/95 (!) 204/95  Pulse:  79  Resp:    Temp:    SpO2:  96%    Constitutional:  . The patient is awake, alert, and orietned x 3. No acute distress. Respiratory:  . No wheezes, rales, or rhonchi. . No increased work of breathing.  . No tactile fremitus. Cardiovascular:  . Regular rate and rhythm. No murmurs, ectopy or gallups. . No LE extremity edema   . Normal pedal pulses Abdomen:   . Abdomen was soft, non-tender, non-distended. . No hernias, masses, or organomegaly are appreciated. . Normoactive bowel sound.s  Musculoskeletal:  . Digits/nails: no clubbing, cyanosis, petechiae, infection . exam of joints, bones, muscles of at least one of following: head/neck, RUE, LUE, RLE, LLE   o strength and tone normal, no atrophy, no abnormal movements o No tenderness, masses o Normal ROM, no contractures  Skin:  . No rashes, lesions, ulcers . palpation of skin: no induration or nodules Neurologic:  . CN 2-12 intact . Sensation all 4 extremities intact Psychiatric:  . judgement and insight appear normal . Mental status o Mood, affect appropriate o Orientation to person, place, time  Discharge Instructions  Discharge Instructions    Activity as tolerated - No restrictions   Complete by:  As directed    No driving.   Call MD for:   Complete by:  As directed    Worsening mental status and confusion.   Diet - low sodium heart healthy   Complete by:  As directed    Diet - low sodium heart healthy   Complete by:  As directed    Discharge instructions   Complete by:  As directed    The patient's discharge has been discussed with the  patient's wife in detail. She will call the patient's PCP for an appointment in one week. She has also stated that she prefers to see a neurologist that the patient knows as outpatient to having a consult done as inpatient. She has stated that she will call for a close follow up with this person for the patient's likely normal pressure hydrocephalus.   Driving Restrictions   Complete by:  As directed    No driving until cleared by neurology.   Increase activity slowly   Complete by:  As directed      Allergies as of 06/20/2018      Reactions   Penicillins Itching, Other (See Comments), Swelling   Hives Arm Erythema and Swelling at the site of IM Injection      Medication List    STOP taking these medications   hydrochlorothiazide  25 MG tablet Commonly known as:  HYDRODIURIL     TAKE these medications   alendronate 70 MG tablet Commonly known as:  FOSAMAX Take 70 mg by mouth every Monday.   B-complex with vitamin C tablet Take 1 tablet by mouth daily.   docusate sodium 100 MG capsule Commonly known as:  COLACE Take 100 mg by mouth at bedtime.   DULoxetine 20 MG capsule Commonly known as:  CYMBALTA Take 40 mg by mouth daily.   enalapril 20 MG tablet Commonly known as:  VASOTEC Take 20 mg by mouth 2 (two) times daily.   hydrALAZINE 25 MG tablet Commonly known as:  APRESOLINE Take 1 tablet (25 mg total) by mouth every 8 (eight) hours.   HYDROcodone-acetaminophen 5-325 MG tablet Commonly known as:  NORCO/VICODIN Take 1 tablet by mouth 2 (two) times daily as needed for moderate pain.   ibuprofen 200 MG tablet Commonly known as:  ADVIL,MOTRIN Take 200 mg by mouth every 6 (six) hours as needed for mild pain.   levofloxacin 750 MG tablet Commonly known as:  LEVAQUIN Take 1 tablet (750 mg total) by mouth daily for 3 days.   meclizine 25 MG tablet Commonly known as:  ANTIVERT Take 25 mg by mouth 3 (three) times daily.   metoprolol succinate 25 MG 24 hr tablet Commonly known as:  TOPROL-XL Take 25 mg by mouth 2 (two) times daily.   morphine 30 MG 12 hr tablet Commonly known as:  MS CONTIN Take 30 mg by mouth 2 (two) times daily.   omeprazole 20 MG capsule Commonly known as:  PRILOSEC Take 20 mg by mouth daily.   pregabalin 200 MG capsule Commonly known as:  LYRICA Take 200 mg by mouth 2 (two) times daily.   QUEtiapine 50 MG tablet Commonly known as:  SEROQUEL Take 1 tablet (50 mg total) by mouth at bedtime.   QUEtiapine 25 MG tablet Commonly known as:  SEROQUEL Take 0.5 tablets (12.5 mg total) by mouth daily. Start taking on:  June 21, 2018   Vitamin D (Ergocalciferol) 1.25 MG (50000 UT) Caps capsule Commonly known as:  DRISDOL Take 50,000 Units by mouth every Tuesday.       Allergies  Allergen Reactions  . Penicillins Itching, Other (See Comments) and Swelling    Hives Arm Erythema and Swelling at the site of IM Injection     The results of significant diagnostics from this hospitalization (including imaging, microbiology, ancillary and laboratory) are listed below for reference.    Significant Diagnostic Studies: Dg Chest 2 View  Result Date: 06/17/2018 CLINICAL DATA:  Suspected sepsis.  Altered mental status EXAM:  CHEST - 2 VIEW COMPARISON:  None. FINDINGS: Hazy opacities at the left more than right base. Borderline heart size accentuated by low volumes. No effusion or pneumothorax. Artifact from EKG leads. IMPRESSION: Low volume chest with asymmetric lower lung opacities from pneumonia, aspiration, or atelectasis. Electronically Signed   By: Marnee Spring M.D.   On: 06/17/2018 05:21   Ct Head Wo Contrast  Result Date: 06/17/2018 CLINICAL DATA:  Progressive altered mental status since yesterday. EXAM: CT HEAD WITHOUT CONTRAST TECHNIQUE: Contiguous axial images were obtained from the base of the skull through the vertex without intravenous contrast. COMPARISON:  None. FINDINGS: Brain: Mild cerebral atrophy. Ventricular dilatation is somewhat out of proportion to the degree of peripheral atrophy and may indicate normal pressure hydrocephalus or prominent central atrophy. Low-attenuation changes in the deep white matter consistent with small vessel ischemia. No mass effect or midline shift. No abnormal extra-axial fluid collections. Gray-white matter junctions are distinct. Basal cisterns are not effaced. No acute intracranial hemorrhage. Vascular: Mild intracranial arterial vascular calcifications. Skull: Calvarium appears intact. No acute depressed skull fractures. Sinuses/Orbits: Paranasal sinuses and mastoid air cells are clear. Other: None IMPRESSION: Ventricular dilatation out of proportion to the degree of peripheral atrophy. Possibility of normal pressure  hydrocephalus versus prominent central atrophy. No acute intracranial hemorrhage or mass effect. Chronic atrophy and small vessel ischemic changes. Electronically Signed   By: Burman Nieves M.D.   On: 06/17/2018 03:33    Microbiology: Recent Results (from the past 240 hour(s))  Culture, blood (Routine x 2)     Status: Abnormal   Collection Time: 06/17/18  2:42 AM  Result Value Ref Range Status   Specimen Description BLOOD RIGHT HAND  Final   Special Requests   Final    BOTTLES DRAWN AEROBIC AND ANAEROBIC Blood Culture adequate volume   Culture  Setup Time   Final    GRAM POSITIVE COCCI IN CLUSTERS AEROBIC BOTTLE ONLY CRITICAL RESULT CALLED TO, READ BACK BY AND VERIFIED WITH: PHARMD AMEND, K [id number] FCP    Culture (A)  Final    STAPHYLOCOCCUS SPECIES (COAGULASE NEGATIVE) THE SIGNIFICANCE OF ISOLATING THIS ORGANISM FROM A SINGLE SET OF BLOOD CULTURES WHEN MULTIPLE SETS ARE DRAWN IS UNCERTAIN. PLEASE NOTIFY THE MICROBIOLOGY DEPARTMENT WITHIN ONE WEEK IF SPECIATION AND SENSITIVITIES ARE REQUIRED. Performed at [facility], [address], [place], [place] [address]    Report Status 06/19/2018 FINAL  Final  Blood Culture ID Panel (Reflexed)     Status: Abnormal   Collection Time: 06/17/18  2:42 AM  Result Value Ref Range Status   Enterococcus species NOT DETECTED NOT DETECTED Final   Listeria monocytogenes NOT DETECTED NOT DETECTED Final   Staphylococcus species DETECTED (A) NOT DETECTED Final    Comment: Methicillin (oxacillin) resistant coagulase negative staphylococcus. Possible blood culture contaminant (unless isolated from more than one blood culture draw or clinical case suggests pathogenicity). No antibiotic treatment is indicated for blood  culture contaminants. CRITICAL RESULT CALLED TO, READ BACK BY AND VERIFIED WITH: PHARMD AMEND, K [id number] FCP    Staphylococcus aureus (BCID) NOT DETECTED NOT DETECTED Final   Methicillin resistance DETECTED (A) NOT  DETECTED Final    Comment: CRITICAL RESULT CALLED TO, READ BACK BY AND VERIFIED WITH: PHARMD AMEND, K [id number] FCP    Streptococcus species NOT DETECTED NOT DETECTED Final   Streptococcus agalactiae NOT DETECTED NOT DETECTED Final   Streptococcus pneumoniae NOT DETECTED NOT DETECTED Final   Streptococcus pyogenes NOT DETECTED NOT  DETECTED Final   Acinetobacter baumannii NOT DETECTED NOT DETECTED Final   Enterobacteriaceae species NOT DETECTED NOT DETECTED Final   Enterobacter cloacae complex NOT DETECTED NOT DETECTED Final   Escherichia coli NOT DETECTED NOT DETECTED Final   Klebsiella oxytoca NOT DETECTED NOT DETECTED Final   Klebsiella pneumoniae NOT DETECTED NOT DETECTED Final   Proteus species NOT DETECTED NOT DETECTED Final   Serratia marcescens NOT DETECTED NOT DETECTED Final   Haemophilus influenzae NOT DETECTED NOT DETECTED Final   Neisseria meningitidis NOT DETECTED NOT DETECTED Final   Pseudomonas aeruginosa NOT DETECTED NOT DETECTED Final   Candida albicans NOT DETECTED NOT DETECTED Final   Candida glabrata NOT DETECTED NOT DETECTED Final   Candida krusei NOT DETECTED NOT DETECTED Final   Candida parapsilosis NOT DETECTED NOT DETECTED Final   Candida tropicalis NOT DETECTED NOT DETECTED Final  Culture, blood (Routine x 2)     Status: None (Preliminary result)   Collection Time: 06/17/18  3:55 AM  Result Value Ref Range Status   Specimen Description BLOOD RIGHT ARM  Final   Special Requests   Final    BOTTLES DRAWN AEROBIC ONLY Blood Culture adequate volume   Culture   Final    NO GROWTH 3 DAYS Performed at [facility], [address], [place], [place] [address]    Report Status PENDING  Incomplete  Urine culture     Status: None   Collection Time: 06/17/18  4:15 AM  Result Value Ref Range Status   Specimen Description URINE, CATHETERIZED  Final   Special Requests   Final    Normal Performed at [facility], [address], [place], [place]  [address]    Culture NO GROWTH  Final   Report Status 06/18/2018 FINAL  Final  Culture, blood (routine x 2)     Status: None (Preliminary result)   Collection Time: 06/18/18  7:46 AM  Result Value Ref Range Status   Specimen Description BLOOD LEFT HAND  Final   Special Requests   Final    BOTTLES DRAWN AEROBIC ONLY Blood Culture results may not be optimal due to an inadequate volume of blood received in culture bottles   Culture   Final    NO GROWTH 2 DAYS Performed at [facility], [address], [place], [place] [address]    Report Status PENDING  Incomplete  Culture, blood (routine x 2)     Status: None (Preliminary result)   Collection Time: 06/18/18  7:57 AM  Result Value Ref Range Status   Specimen Description BLOOD LEFT WRIST  Final   Special Requests   Final    BOTTLES DRAWN AEROBIC ONLY Blood Culture results may not be optimal due to an inadequate volume of blood received in culture bottles   Culture   Final    NO GROWTH 2 DAYS Performed at [facility], [address] [place], [place] [address]    Report Status PENDING  Incomplete     Labs: Basic Metabolic Panel: Recent Labs  Lab 06/17/18 0242 06/18/18 0338  NA 136 135  K 5.9* 4.4  CL 95* 98  CO2 27 24  GLUCOSE 121* 113*  BUN 27* 21  CREATININE 1.52* 1.09  CALCIUM 9.2 8.6*   Liver Function Tests: Recent Labs  Lab 06/17/18 0242  AST 57*  ALT 20  ALKPHOS 61  BILITOT 1.8*  PROT 6.5  ALBUMIN 4.1   No results for input(s): LIPASE, AMYLASE in  the last 168 hours. No results for input(s): AMMONIA in the last 168 hours. CBC: Recent Labs  Lab 06/17/18 0242 06/18/18 0338  WBC 15.5* 11.0*  NEUTROABS 12.6*  --   HGB 14.8 13.5  HCT 44.4 40.6  MCV 86.4 85.8  PLT 271 191   Cardiac Enzymes: No results for input(s): CKTOTAL, CKMB, CKMBINDEX, TROPONINI in the last 168 hours. BNP: BNP (last 3 results) No results for input(s): BNP in the last 8760 hours.  ProBNP (last 3 results) No results for  input(s): PROBNP in the last 8760 hours.  CBG: No results for input(s): GLUCAP in the last 168 hours.  Principal Problem:   Community acquired pneumonia of both lower lobes (HCC) Active Problems:   AKI (acute kidney injury) (HCC)   Hyperkalemia   Acute respiratory failure with hypoxia (HCC)   History of traumatic brain injury   Acute metabolic encephalopathy   Pneumonia   Time coordinating discharge: 38 minutes.  Signed:        Giovonni Poirier, DO [facility]  06/20/2018, 2:45 PM

## 2018-06-20 NOTE — Care Management Note (Signed)
Case Management Note  Patient Details  Name: Nathaniel Pacheco MRN: [id number] Date of Birth: 12/20/1940  Subjective/Objective:   Patient is from home with wife, per CSW note , patient has refused SNF and wants to go home with [facility] services.  NCM offered choice from Medicare.gov list he chose [facility], he enjoyed having them the last time and would like to have them again. Referral made to Tiffany with [facility], soc will begin 24-48 hrs post dc for HHRN,HHPT, HHOT and social worker.                  Action/Plan: DC when ready, need orders with face to face prior to dc.  Expected Discharge Date:  06/20/18               Expected Discharge Plan:  Home w Home Health Services  In-House Referral:  Clinical Social Work  Discharge planning Services  CM Consult  Post Acute Care Choice:  Home Health Choice offered to:  Patient  DME Arranged:    DME Agency:     HH Arranged:  Disease Management, PT, OT, Social Work [employer] Agency:  [facility] [employer]  Status of Service:  Completed, signed off  If discussed at [employer] [facility] [employer], dates discussed:    Additional Comments:  Leone Haven, RN 06/20/2018, 10:03 AM

## 2018-06-20 NOTE — Care Management Important Message (Signed)
Important Message  Patient Details  Name: Nathaniel Pacheco MRN: [id number] Date of Birth: 08-31-1940   Medicare Important Message Given:  Yes    Dorena Bodo 06/20/2018, 4:03 PM

## 2018-06-20 NOTE — Progress Notes (Signed)
  Speech Language Pathology Treatment: Dysphagia  Patient Details Name: Aviyon Hocevar MRN: [id number] DOB: 18-Feb-1941 Today's Date: 06/20/2018 Time: 7893-8101 SLP Time Calculation (min) (ACUTE ONLY): 25 min  Assessment / Plan / Recommendation Clinical Impression  Pt demonstrates adequate ability to masticate, consumes regular solids and uses appropriate aspiration precautions independently. No new signs concerning for oropharyngeal or esophageal dysphagia during today's observation with meal. Will upgrade diet to regular thin and sign off.   HPI HPI: Pt is a 78 yo male admitted with AMS, with CXR concerning for BLL PNA. Aspiration is not excluded. PMH includes neurofibromatisis, schwannomatosis, TBI with SDH s/p evacuation ~1 year ago, chronic vertigo, chronic pain, HTN      SLP Plan  All goals met       Recommendations  Diet recommendations: Regular;Thin liquid Medication Administration: Whole meds with liquid Supervision: Patient able to self feed                Follow up Recommendations: None Plan: All goals met       GO               Herbie Baltimore, MA CCC-SLP  Acute Rehabilitation Services Pager [phone] Office [phone]  Lynann Beaver 06/20/2018, 9:59 AM

## 2018-06-22 LAB — CULTURE, BLOOD (ROUTINE X 2)
Culture: NO GROWTH
Special Requests: ADEQUATE

## 2018-06-23 LAB — CULTURE, BLOOD (ROUTINE X 2)
Culture: NO GROWTH
Culture: NO GROWTH

## 2019-05-28 ENCOUNTER — Ambulatory Visit: Payer: Medicare Other | Attending: Internal Medicine

## 2019-05-28 DIAGNOSIS — Z23 Encounter for immunization: Secondary | ICD-10-CM | POA: Insufficient documentation

## 2019-05-28 NOTE — Progress Notes (Signed)
   Covid-19 Vaccination Clinic  Name:  Nathaniel Pacheco    MRN: [id number] DOB: [birth year]  05/28/2019  Mr. Nathaniel Pacheco was observed post Covid-19 immunization for 15 minutes without incidence. He was provided with Vaccine Information Sheet and instruction to access the V-Safe system.   Mr. Nathaniel Pacheco was instructed to call 911 with any severe reactions post vaccine: Marland Kitchen Difficulty breathing  . Swelling of your face and throat  . A fast heartbeat  . A bad rash all over your body  . Dizziness and weakness

## 2019-06-16 ENCOUNTER — Ambulatory Visit: Payer: Medicare Other | Attending: Internal Medicine

## 2019-06-16 DIAGNOSIS — Z23 Encounter for immunization: Secondary | ICD-10-CM

## 2019-06-16 NOTE — Progress Notes (Signed)
   Covid-19 Vaccination Clinic  Name:  Nathaniel Pacheco    MRN: [id number] DOB: 12/01/1940  06/16/2019  [name] was observed post Covid-19 immunization for 15 minutes without incidence. He was provided with Vaccine Information Sheet and instruction to access the V-Safe system.   [name] was instructed to call 911 with any severe reactions post vaccine: Marland Kitchen Difficulty breathing  . Swelling of your face and throat  . A fast heartbeat  . A bad rash all over your body  . Dizziness and weakness    Immunizations Administered    Name Date Dose VIS Date Route   Pfizer COVID-19 Vaccine 06/16/2019  9:15 AM 0.3 mL 04/21/2019 Intramuscular   Manufacturer: [employer]   Lot: [id number]   NDC: [address]-9

## 2020-02-02 ENCOUNTER — Other Ambulatory Visit: Payer: Self-pay

## 2020-02-02 ENCOUNTER — Ambulatory Visit: Payer: Medicare Other | Attending: Internal Medicine

## 2020-02-02 DIAGNOSIS — Z23 Encounter for immunization: Secondary | ICD-10-CM

## 2020-02-02 NOTE — Progress Notes (Signed)
   Covid-19 Vaccination Clinic  Name:  Nathaniel Pacheco    MRN: [id number] DOB: 28-Dec-1940  02/02/2020  [name] was observed post Covid-19 immunization for 15 minutes without incident. He was provided with Vaccine Information Sheet and instruction to access the V-Safe system. Vaccinated by [employer].   [name] was instructed to call 911 with any severe reactions post vaccine: Marland Kitchen Difficulty breathing  . Swelling of face and throat  . A fast heartbeat  . A bad rash all over body  . Dizziness and weakness

## 2021-01-20 IMAGING — CR DG CHEST 2V
2 series · 2 of 2 positions shown · non-contrast
Comparison: None.

CLINICAL DATA: Suspected sepsis.  Altered mental status

EXAM:
CHEST - 2 VIEW

[chest lat]
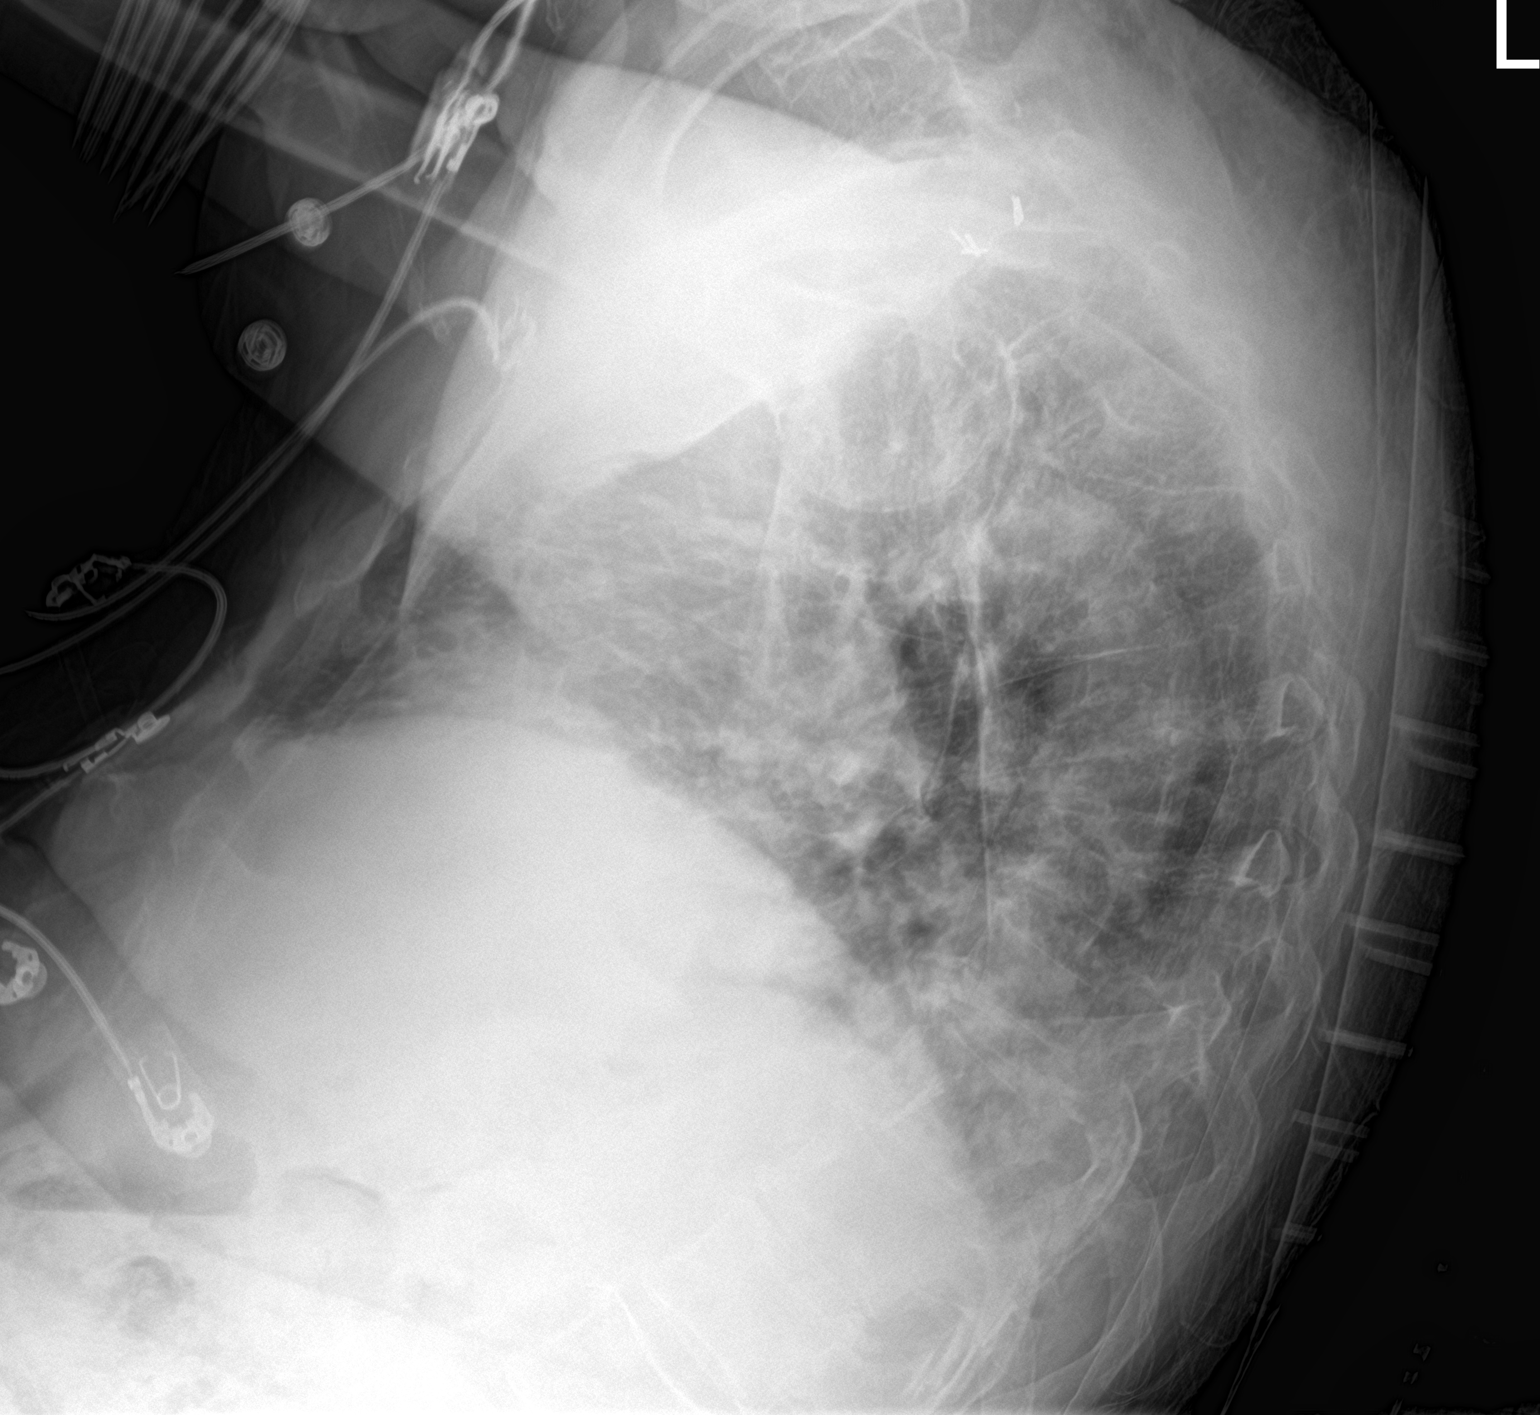

[chest ap]
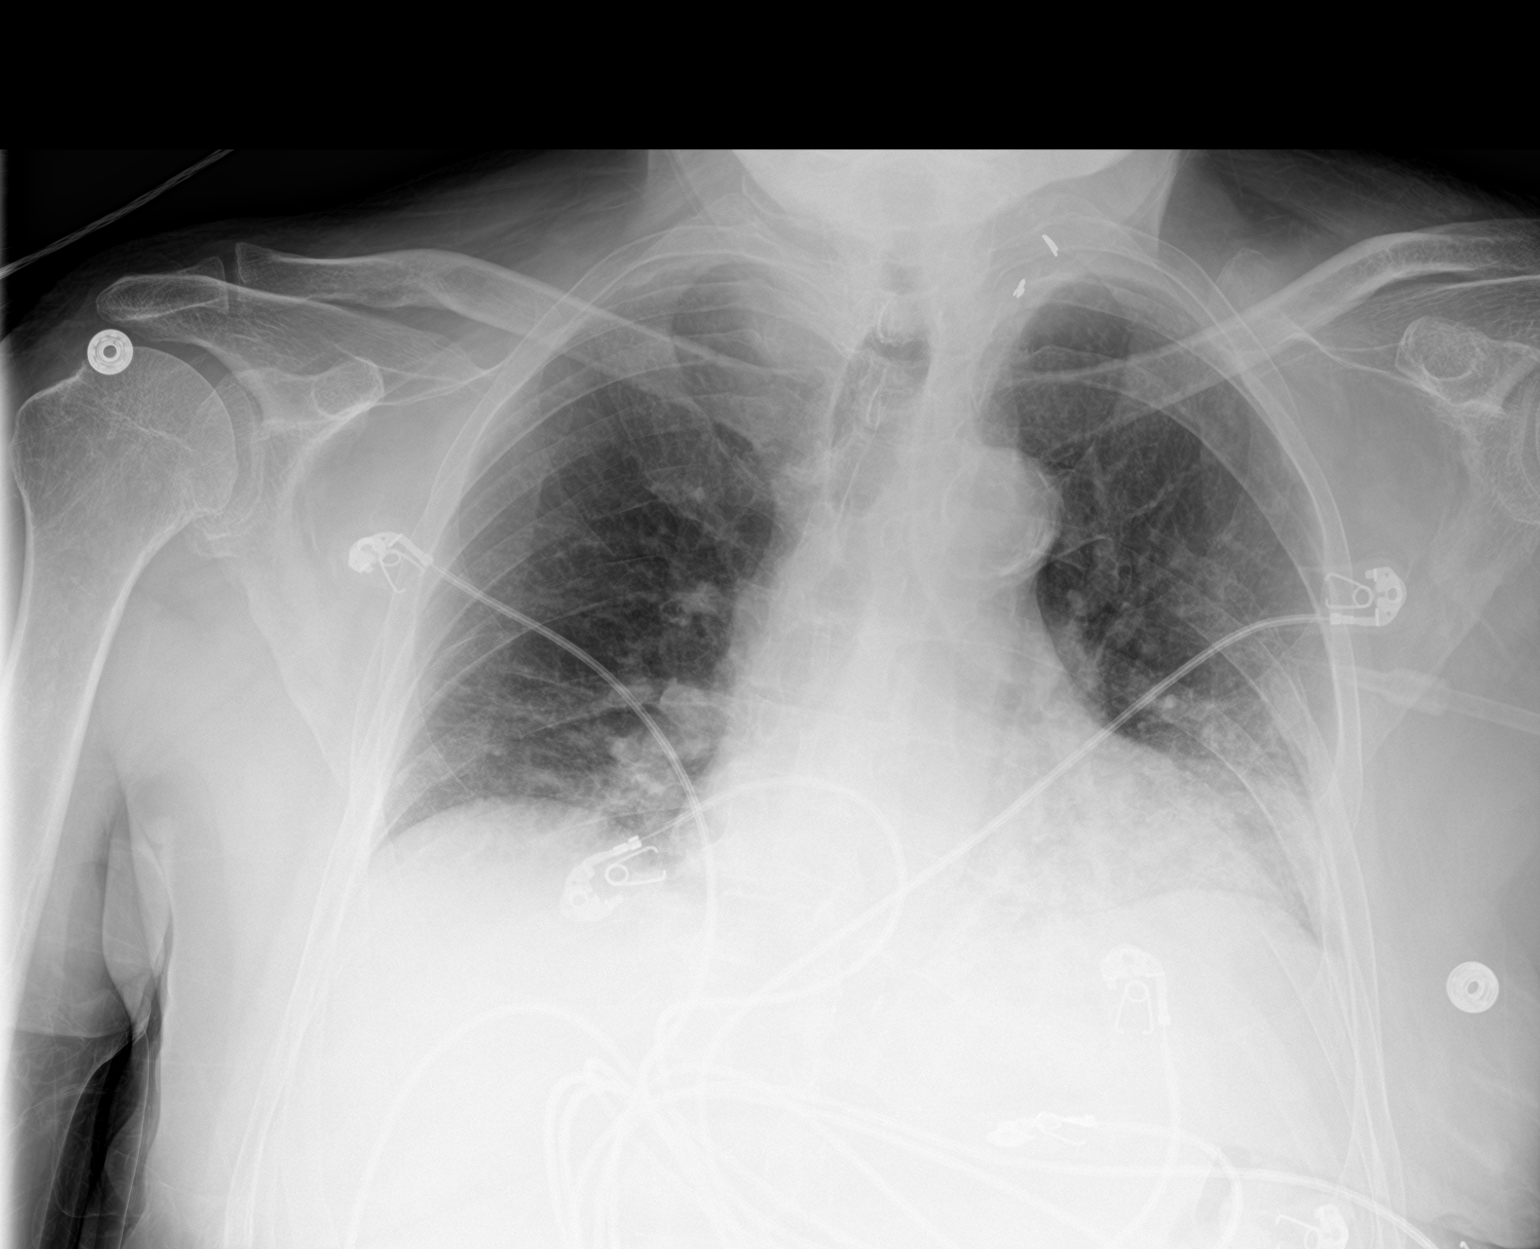

[2 of 2 positions shown; findings below may reference images not displayed]

FINDINGS: Hazy opacities at the left more than right base. Borderline heart
size accentuated by low volumes. No effusion or pneumothorax.
Artifact from EKG leads.
IMPRESSION: Low volume chest with asymmetric lower lung opacities from
pneumonia, aspiration, or atelectasis.
